# Patient Record
Sex: Female | Born: 1965 | Race: White | Hispanic: No
Health system: Southern US, Academic
[De-identification: ages and names within clinical notes are randomized; demographics above are authoritative.]

## PROBLEM LIST (undated history)

## (undated) DIAGNOSIS — I10 Essential (primary) hypertension: Secondary | ICD-10-CM

## (undated) DIAGNOSIS — I4891 Unspecified atrial fibrillation: Secondary | ICD-10-CM

## (undated) DIAGNOSIS — F419 Anxiety disorder, unspecified: Secondary | ICD-10-CM

## (undated) DIAGNOSIS — J45909 Unspecified asthma, uncomplicated: Secondary | ICD-10-CM

## (undated) DIAGNOSIS — K219 Gastro-esophageal reflux disease without esophagitis: Secondary | ICD-10-CM

## (undated) DIAGNOSIS — N200 Calculus of kidney: Secondary | ICD-10-CM

## (undated) DIAGNOSIS — F32A Depression, unspecified: Secondary | ICD-10-CM

## (undated) DIAGNOSIS — F319 Bipolar disorder, unspecified: Secondary | ICD-10-CM

## (undated) DIAGNOSIS — J449 Chronic obstructive pulmonary disease, unspecified: Secondary | ICD-10-CM

## (undated) DIAGNOSIS — K279 Peptic ulcer, site unspecified, unspecified as acute or chronic, without hemorrhage or perforation: Secondary | ICD-10-CM

## (undated) HISTORY — DX: Calculus of kidney: N20.0

## (undated) HISTORY — PX: FACIAL FRACTURE SURGERY: SHX1570

## (undated) HISTORY — DX: Unspecified asthma, uncomplicated: J45.909

## (undated) HISTORY — DX: Chronic obstructive pulmonary disease, unspecified (CMS HCC): J44.9

## (undated) HISTORY — DX: Unspecified atrial fibrillation (CMS HCC): I48.91

## (undated) HISTORY — PX: WRIST SURGERY: SHX841

## (undated) HISTORY — DX: Anxiety disorder, unspecified: F41.9

## (undated) HISTORY — PX: HX GALL BLADDER SURGERY/CHOLE: SHX55

## (undated) HISTORY — DX: Unspecified atrial fibrillation: I48.91

## (undated) HISTORY — DX: Depression, unspecified: F32.A

## (undated) HISTORY — DX: Gastro-esophageal reflux disease without esophagitis: K21.9

## (undated) HISTORY — DX: Essential (primary) hypertension: I10

## (undated) HISTORY — DX: Peptic ulcer, site unspecified, unspecified as acute or chronic, without hemorrhage or perforation: K27.9

---

## 2014-05-21 ENCOUNTER — Emergency Department (HOSPITAL_COMMUNITY)
Admission: EM | Admit: 2014-05-21 | Discharge: 2014-05-21 | Disposition: A | Discharge status: Home / Self Care | Payer: Medicaid - Out of State | Attending: Emergency Medicine | Admitting: Emergency Medicine

## 2014-05-21 ENCOUNTER — Emergency Department (HOSPITAL_COMMUNITY): Payer: Medicaid - Out of State

## 2014-05-21 ENCOUNTER — Encounter (HOSPITAL_COMMUNITY): Payer: Self-pay | Admitting: *Deleted

## 2014-05-21 DIAGNOSIS — J069 Acute upper respiratory infection, unspecified: Secondary | ICD-10-CM

## 2014-05-21 DIAGNOSIS — Z792 Long term (current) use of antibiotics: Secondary | ICD-10-CM | POA: Diagnosis not present

## 2014-05-21 DIAGNOSIS — Z880 Allergy status to penicillin: Secondary | ICD-10-CM | POA: Diagnosis not present

## 2014-05-21 DIAGNOSIS — Z7952 Long term (current) use of systemic steroids: Secondary | ICD-10-CM | POA: Insufficient documentation

## 2014-05-21 DIAGNOSIS — R05 Cough: Secondary | ICD-10-CM

## 2014-05-21 DIAGNOSIS — Z79899 Other long term (current) drug therapy: Secondary | ICD-10-CM | POA: Insufficient documentation

## 2014-05-21 DIAGNOSIS — R059 Cough, unspecified: Secondary | ICD-10-CM

## 2014-05-21 MED ORDER — AZITHROMYCIN 250 MG PO TABS: ORAL_TABLET | ORAL | Status: AC

## 2014-05-21 MED ORDER — HYDROCOD POLST-CHLORPHEN POLST 10-8 MG/5ML PO LQCR
5.0000 mL | Freq: Once | ORAL | Status: AC
  Administered 2014-05-21: 5 mL via ORAL
  Filled 2014-05-21: qty 5

## 2014-05-21 MED ORDER — ALBUTEROL SULFATE HFA 108 (90 BASE) MCG/ACT IN AERS: 1.0000 | INHALATION_SPRAY | Freq: Four times a day (QID) | RESPIRATORY_TRACT | Status: AC | PRN

## 2014-05-21 MED ORDER — HYDROCOD POLST-CHLORPHEN POLST 10-8 MG/5ML PO LQCR: 5.0000 mL | Freq: Two times a day (BID) | ORAL | Status: AC | PRN

## 2014-05-21 MED ORDER — PREDNISONE 50 MG PO TABS: ORAL_TABLET | ORAL | Status: AC

## 2014-05-21 NOTE — ED Notes (Signed)
Pt with congestion, ? Fever, cough-productive, +N/V since Tuesday
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
## 2014-05-21 NOTE — Discharge Instructions (Signed)
Chest x-ray showed no pneumonia. Would hold antibiotic for a couple days.  Increase fluids. Prescription for cough syrup, prednisone, inhaler, antibiotic.

## 2015-08-14 IMAGING — DX DG CHEST 2V
2 series · 2 of 2 positions shown · non-contrast
Comparison: None

CLINICAL DATA: Cough

EXAM:
CHEST  2 VIEW

[chest pa]
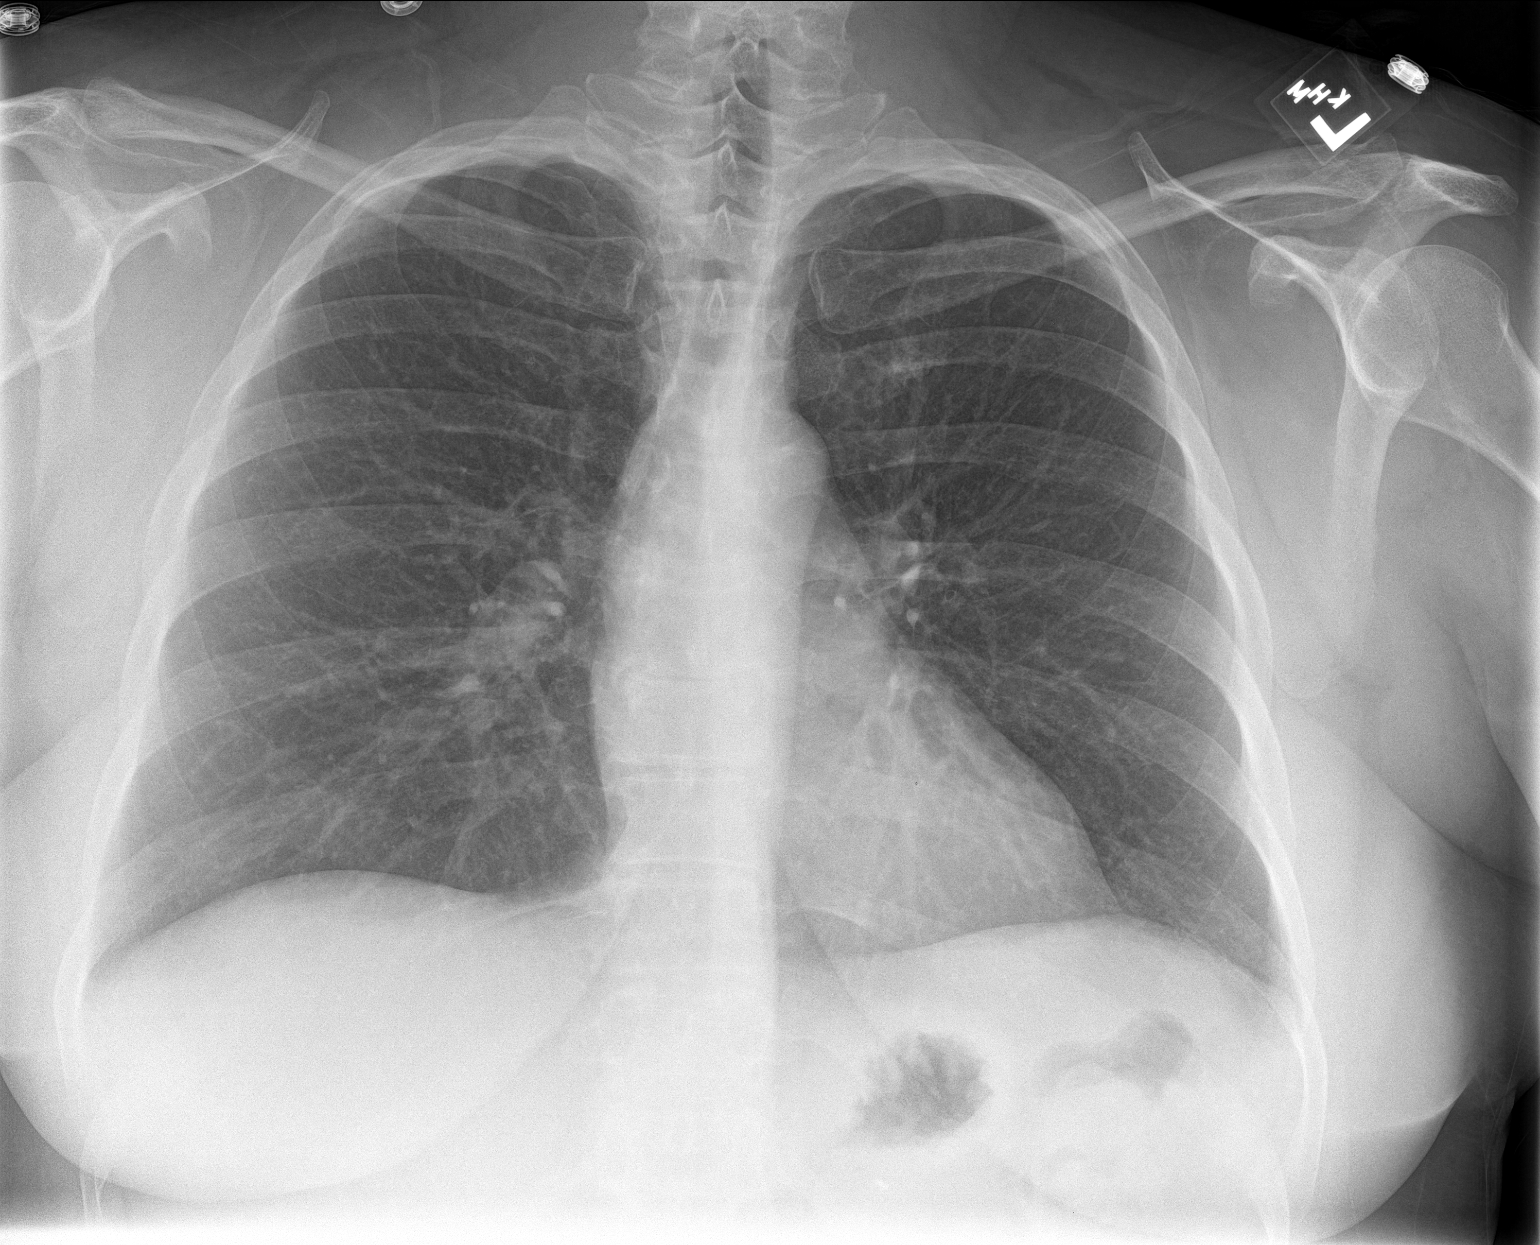

[chest lat]
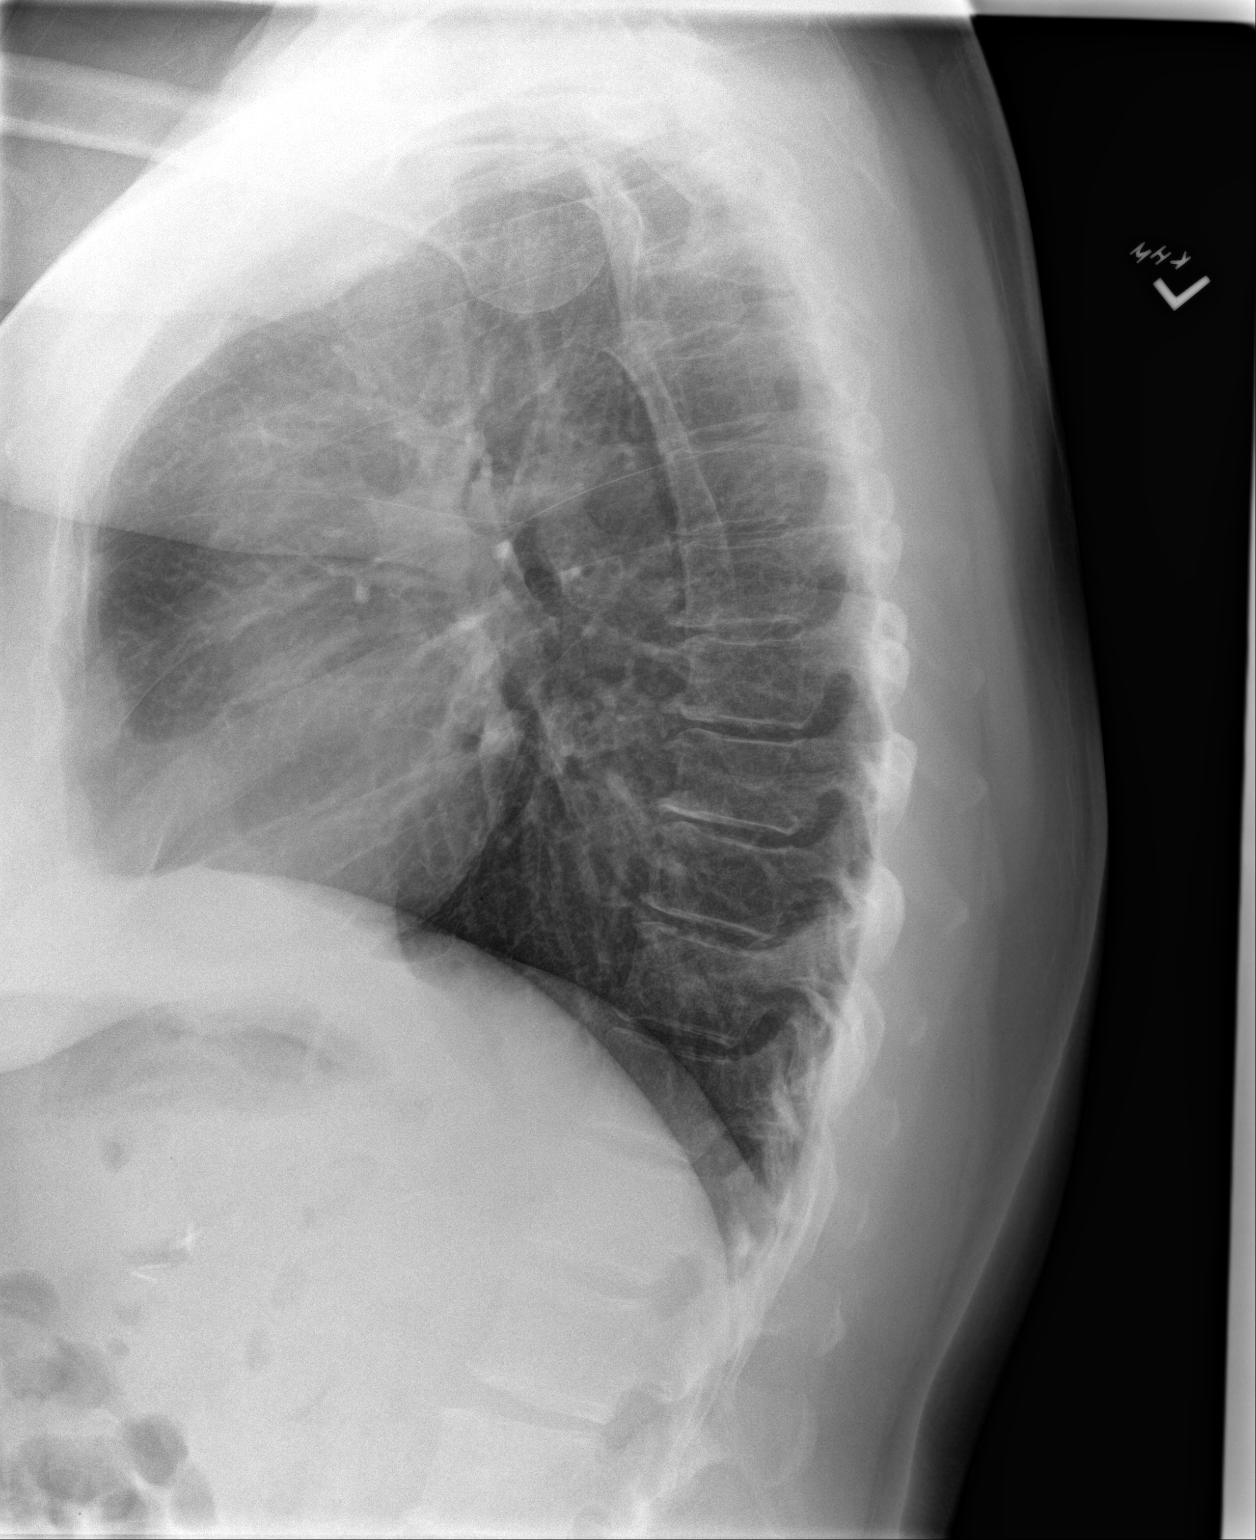

[2 of 2 positions shown; findings below may reference images not displayed]

FINDINGS: Normal heart size, mediastinal contours, and pulmonary vascularity.

Lungs clear.

No pleural effusion or pneumothorax.

Bones unremarkable.
IMPRESSION: Normal exam.

## 2018-02-23 ENCOUNTER — Ambulatory Visit (HOSPITAL_COMMUNITY): Payer: Self-pay

## 2021-06-10 ENCOUNTER — Encounter (HOSPITAL_COMMUNITY): Payer: Self-pay

## 2021-06-10 ENCOUNTER — Other Ambulatory Visit: Payer: Self-pay

## 2021-06-10 ENCOUNTER — Emergency Department
Admission: EM | Admit: 2021-06-10 | Discharge: 2021-06-10 | Disposition: A | Discharge status: Home / Self Care | Payer: Medicaid Other | Attending: Emergency Medicine | Admitting: Emergency Medicine

## 2021-06-10 ENCOUNTER — Emergency Department (HOSPITAL_COMMUNITY): Payer: Medicaid Other

## 2021-06-10 DIAGNOSIS — J069 Acute upper respiratory infection, unspecified: Secondary | ICD-10-CM | POA: Insufficient documentation

## 2021-06-10 DIAGNOSIS — Z20822 Contact with and (suspected) exposure to covid-19: Secondary | ICD-10-CM | POA: Insufficient documentation

## 2021-06-10 DIAGNOSIS — J019 Acute sinusitis, unspecified: Secondary | ICD-10-CM | POA: Insufficient documentation

## 2021-06-10 HISTORY — DX: Bipolar disorder, unspecified (CMS HCC): F31.9

## 2021-06-10 LAB — COVID-19 ~~LOC~~ MOLECULAR LAB TESTING: SARS-CoV-2: NOT DETECTED

## 2021-06-10 LAB — INFLUENZA VIRUS TYPE A AND TYPE B, PCR
INFLUENZA A RNA: NEGATIVE
INFLUENZA B RNA: NEGATIVE

## 2021-06-10 MED ORDER — AZITHROMYCIN 500 MG TABLET
500.0000 mg | ORAL_TABLET | Freq: Every day | ORAL | 0 refills | Status: AC
Start: 2021-06-10 — End: 2021-06-13

## 2021-06-10 MED ORDER — BENZONATATE 100 MG CAPSULE
100.0000 mg | ORAL_CAPSULE | Freq: Three times a day (TID) | ORAL | 0 refills | Status: DC | PRN
Start: 2021-06-10 — End: 2021-10-07

## 2021-06-10 NOTE — ED Provider Notes (Signed)
Lauren Berger  ED Primary Provider Note  History of Present Illness   Chief Complaint   Patient presents with   . Cough     Nonproductive cough, nasal congestion x 3 days     Arrival: The patient arrived by Car accompanied by:  Self   History Provided by:  Self .  History Limitations:  None  Lauren Berger is a 56 y.o. female who had concerns including Cough.   Patient complained of a cough congestion frontal headache pressure over sinuses coughing up thick mucus some shortness breath at time pain in the left lower rib cage under left breast.  It has been going on for 3 days tried over-the-counter medication nothing helping he took COVID test at home 3 times and was negative she previously had COVID vaccine but did not have the influenza vaccine, and the other hand patient states she has not been out of the house or exposed to anybody and no visitors and she lives alone, states she does not know how she got this.    Constitutional: No fevers or chills.  Skin:  No rashes or lesions.  HENT:  See HPI  Eyes:  No vision changes, no redness, no discharge.  Cardio:  No chest pain, no palpitations.  Respiratory:  See HPI  GI:  No nausea, vomiting, diarrhea, constipation, no abdominal pain.  GU:  No dysuria, urgency, or frequency.  Endocrine: No polyuria, polydypsia, thyroid issues.  Hematology:  No easy bruising or bleeding.  Musculoskeletal:  No neck pain, no back pain.  Neuro:  No numbness, tingling, or weakness.  No headache.  Psychiatry:  No SI or HI.  Normal mood.   All other ROS are negative except as above and per HPI.    Historical Data   Below pertinent information reviewed with patient/caregiver and/or EMR:  Past Medical History:   Diagnosis Date   . Bipolar 1 disorder (CMS HCC)      Previous Medications    LITHIUM CARBONATE (ESKALITH) 300 MG ORAL CAPSULE    Take 1 Capsule (300 mg total) by mouth Three times daily with meals     Allergies   Allergen Reactions   . Penicillins       Past Surgical History:   Procedure Laterality Date   . FACIAL FRACTURE SURGERY     . HX CESAREAN SECTION     . WRIST SURGERY Right        Physical Exam   ED Triage Vitals [06/10/21 0806]   BP (Non-Invasive) 116/66   Heart Rate 88   Respiratory Rate 18   Temperature 37.1 C (98.8 F)   SpO2 96 %   Weight 132 kg (290 lb)   Height 1.676 m (5\' 6" )     Nursing notes and vital signs reviewed.      Constitutional:  Patient is well-developed and well-nourished.  A&O x 3.   Head:  Normocephalic and atraumatic.  Neck:  Soft, supple, painless range of motion.  Eyes:   Ears, Nose, Throat:  Both external ears normal.  Nares clear, no congestion.  Uvula midline.  Oropharynx without erythema. Mucous membranes moist.  There is tenderness or a frontal and maxillary sinuses without swelling.  Cardiovascular:  Regular rate.  Regular rhythm. No, murmur, rub, or gallop.  Distal pulses present and equal bilaterally.  Pulmonary:  Airway normal.  Bilateral breath sounds clear, without wheezes, rales or rhonchi.  Abdominal: Soft, nontender, nondistended, positive bowel sounds.  No rebound,  guarding, or masses.  Neurological:  Cranial nerves 2-12 grossly intact.  No focal motor or sensory deficits noted.  Musculoskeletal:  No acute gross deformities.  Normal gait.  Extremities:  No clubbing, cyanosis.  No edema.   Psychiatric: Patient has a normal mood and affect.   Skin: Warm and dry. No rash or lesions.  Normal cap refill <2s.   Breast: deferred.  Genitourinary: deferred.  Rectal: deferred.    Patient Data     Labs:   Labs Ordered/Reviewed   COVID-19 [place] [facility]LECULAR LAB TESTING - Normal   INFLUENZA VIRUS TYPE A AND TYPE B, PCR - Normal     Imaging:  XR AP MOBILE CHEST   Final Result by Edi, Radresults In (02/05 0935)   NO ACUTE FINDINGS.         Radiologist location ID: [id number]           Procedures:  Procedures      Medical Decision Making   MDM/Course:  Lauren Berger is a 56 y.o. female who presented with:             Medical Decision Making  Symptoms consistent viral illness could be influenza could be COVID also consider sinus infection bronchitis and pneumonia will be testing for COVID and influenza as well as a chest x-ray to rule out pneumonia    Acute non-recurrent sinusitis, unspecified location: acute illness or injury     Details: Sinusitis could be bacterial or viral will go ahead and try her on Zithromax.  Upper respiratory tract infection, unspecified type: acute illness or injury     Details: This is mostly bottle will give her Tessalon Perles to treat her symptoms.  Amount and/or Complexity of Data Reviewed  Radiology: ordered.               Medications given:       Clinical Impression   Acute non-recurrent sinusitis, unspecified location (Primary)   Upper respiratory tract infection, unspecified type     Following the history, physical exam, and ED workup, the patient was deemed stable and suitable for discharge. The patient/caregiver was advised to return to the ED for any new or worsening symptoms. Discharge medications, and follow-up instructions were discussed with the patient/caregiver in detail, who verbalizes understanding. The patient/caregiver is in agreement and is comfortable with the plan of care.    Disposition: Discharged         Current Discharge Medication List      START taking these medications.      Details   azithromycin 500 mg Tablet  Commonly known as: ZITHROMAX   500 mg, Oral, DAILY  Qty: 3 Tablet  Refills: 0     benzonatate 100 mg Capsule  Commonly known as: TESSALON   100 mg, Oral, EVERY 8 HOURS PRN  Qty: 15 Capsule  Refills: 0        CONTINUE these medications - NO CHANGES were made during your visit.      Details   lithium carbonate 300 mg Capsule  Commonly known as: ESKALITH   300 mg, Oral, 3 TIMES DAILY WITH MEALS  Refills: 0          Follow up:   Juanna Cao, [place]  [place] [address]  [phone]    In 3 days  If symptoms worsen                  Eugenio Hoes, MD    Parts  of this patients chart were completed in a retrospective fashion due to simultaneous direct patient care activities in the Emergency Department. This note was partially generated using MModal Fluency Direct system, and there may be some incorrect words, spellings, and punctuation that were not noted in checking the note before saving.

## 2021-06-10 NOTE — ED Triage Notes (Signed)
Patient ambulatory to department for cough, nasal congestion x 3 days. Patient states has taken 3 Covid tests, all negative. Last test was last night. Patient states chest feels congested as well, has a nonproductive cough.

## 2021-06-10 NOTE — ED Nurses Note (Signed)
Discharge instructions provided to and reviewed with patient, who verbalized understanding. Discharged from department, ambulatory, in no acute distress.

## 2021-07-19 ENCOUNTER — Other Ambulatory Visit: Payer: Self-pay

## 2021-07-19 ENCOUNTER — Other Ambulatory Visit (HOSPITAL_COMMUNITY): Payer: Self-pay | Admitting: NURSE PRACTITIONER

## 2021-07-19 ENCOUNTER — Other Ambulatory Visit (HOSPITAL_COMMUNITY): Payer: Medicaid Other

## 2021-07-19 ENCOUNTER — Inpatient Hospital Stay (HOSPITAL_COMMUNITY)
Admission: RE | Admit: 2021-07-19 | Discharge: 2021-07-19 | Disposition: A | Discharge status: Home / Self Care | Payer: Medicaid Other | Source: Ambulatory Visit | Attending: NURSE PRACTITIONER | Admitting: NURSE PRACTITIONER

## 2021-07-19 ENCOUNTER — Inpatient Hospital Stay
Admission: RE | Admit: 2021-07-19 | Discharge: 2021-07-19 | Disposition: A | Discharge status: Home / Self Care | Payer: Medicaid Other | Source: Ambulatory Visit | Attending: NURSE PRACTITIONER | Admitting: NURSE PRACTITIONER

## 2021-07-19 DIAGNOSIS — F319 Bipolar disorder, unspecified: Secondary | ICD-10-CM | POA: Insufficient documentation

## 2021-07-19 DIAGNOSIS — R635 Abnormal weight gain: Secondary | ICD-10-CM | POA: Insufficient documentation

## 2021-07-19 DIAGNOSIS — R600 Localized edema: Secondary | ICD-10-CM

## 2021-07-19 DIAGNOSIS — Z5181 Encounter for therapeutic drug level monitoring: Secondary | ICD-10-CM

## 2021-07-19 LAB — CBC WITH DIFF
BASOPHIL #: 0.1 10*3/uL (ref 0.00–0.20)
BASOPHIL %: 1 % (ref 0–2)
EOSINOPHIL #: 0.2 10*3/uL (ref 0.00–0.60)
EOSINOPHIL %: 3 % (ref 0–5)
HCT: 37.8 % (ref 36.0–48.0)
HGB: 12.6 g/dL (ref 12.0–15.0)
LYMPHOCYTE #: 2.3 10*3/uL (ref 1.10–3.80)
LYMPHOCYTE %: 31 % (ref 19–46)
MCH: 28.1 pg (ref 24.4–34.0)
MCHC: 33.3 g/dL (ref 30.0–37.0)
MCV: 84.2 fL (ref 79.0–88.0)
MONOCYTE #: 0.5 10*3/uL (ref 0.10–0.80)
MONOCYTE %: 6 % (ref 4–12)
MPV: 8 fL (ref 7.5–11.5)
NEUTROPHIL #: 4.3 10*3/uL (ref 1.80–7.50)
NEUTROPHIL %: 59 % (ref 41–69)
PLATELETS: 311 10*3/uL (ref 130–400)
RBC: 4.48 10*6/uL (ref 3.50–5.50)
RDW: 13.9 % (ref 11.5–14.0)
WBC: 7.3 10*3/uL (ref 4.5–11.5)

## 2021-07-19 LAB — COMPREHENSIVE METABOLIC PNL, FASTING
ALBUMIN/GLOBULIN RATIO: 1.6 (ref 1.5–2.5)
ALBUMIN: 4.2 g/dL (ref 3.5–5.0)
ALKALINE PHOSPHATASE: 71 U/L (ref 38–126)
ALT (SGPT): 32 U/L (ref 5–34)
ANION GAP: 5 mmol/L (ref 5–19)
AST (SGOT): 23 U/L (ref 14–36)
BILIRUBIN TOTAL: 0.7 mg/dL (ref 0.2–1.3)
BUN/CREA RATIO: 23 — ABNORMAL HIGH (ref 6–20)
BUN: 18 mg/dL — ABNORMAL HIGH (ref 7–17)
CALCIUM: 8.9 mg/dL (ref 8.4–10.2)
CHLORIDE: 108 mmol/L — ABNORMAL HIGH (ref 98–107)
CO2 TOTAL: 25 mmol/L (ref 22–30)
CREATININE: 0.8 mg/dL (ref 0.50–1.00)
ESTIMATED GFR: 86 mL/min/{1.73_m2} (ref >60)
GLUCOSE: 121 mg/dL — ABNORMAL HIGH (ref 74–106)
POTASSIUM: 4.2 mmol/L (ref 3.5–5.1)
PROTEIN TOTAL: 6.9 g/dL (ref 6.3–8.2)
SODIUM: 138 mmol/L (ref 137–145)

## 2021-07-19 LAB — LITHIUM LEVEL: LITHIUM LEVEL: 0.21 mmol/L — ABNORMAL LOW (ref 0.60–1.20)

## 2021-07-20 LAB — ECG 12-LEAD
Atrial Rate: 71 {beats}/min
Calculated P Axis: 43 degrees
Calculated R Axis: 31 degrees
Calculated T Axis: 31 degrees
PR Interval: 160 ms
QRS Duration: 98 ms
QT Interval: 392 ms
QTC Calculation: 425 ms
Ventricular rate: 71 {beats}/min

## 2021-07-20 LAB — THYROID STIMULATING HORMONE WITH FREE T4 REFLEX: TSH: 2.3 u[IU]/mL (ref 0.470–4.680)

## 2021-08-03 ENCOUNTER — Other Ambulatory Visit: Payer: Self-pay

## 2021-10-07 ENCOUNTER — Encounter (INDEPENDENT_AMBULATORY_CARE_PROVIDER_SITE_OTHER): Payer: Self-pay | Admitting: NEPHROLOGY

## 2021-10-11 ENCOUNTER — Other Ambulatory Visit: Payer: Self-pay

## 2021-10-11 ENCOUNTER — Ambulatory Visit (INDEPENDENT_AMBULATORY_CARE_PROVIDER_SITE_OTHER): Payer: Medicaid Other | Admitting: Internal Medicine

## 2021-10-11 ENCOUNTER — Encounter (INDEPENDENT_AMBULATORY_CARE_PROVIDER_SITE_OTHER): Payer: Self-pay | Admitting: Internal Medicine

## 2021-10-11 VITALS — BP 118/72 | HR 80 | Temp 97.6°F | Resp 18 | Ht 66.0 in | Wt 302.0 lb

## 2021-10-11 DIAGNOSIS — N182 Chronic kidney disease, stage 2 (mild): Secondary | ICD-10-CM

## 2021-10-11 DIAGNOSIS — F319 Bipolar disorder, unspecified: Secondary | ICD-10-CM

## 2021-10-11 NOTE — Progress Notes (Signed)
NEPHROLOGY, [facility]  [place] [address]  Phone: [phone]  Fax: [phone]      Encounter Date: 10/11/2021    Patient ID: Lauren Berger  MRN: [id number]    DOB: 1965/09/16  Age: 56 y.o. female     Progress Note       Reason for Visit: Follow up for No chief complaint on file.       History of Present Illness:  Lauren Berger is a 56 y.o. female  here for follow up of questionable renal mass shown on renal US as well CKD2 f/u. She has history of bipolar disorder with long time use of Lithium. Baseline creatinine is normal, most recent creatinine .98. She voices no concerns.    Patient History:  There is no problem list on file for this patient.    Past Medical History:   Diagnosis Date   . Anxiety    . Asthma    . Bipolar 1 disorder (CMS HCC)    . Depression    . Esophageal reflux    . Hypertension    . Kidney stones    . PUD (peptic ulcer disease)       Past Surgical History:   Procedure Laterality Date   . FACIAL FRACTURE SURGERY     . HX CESAREAN SECTION     . HX CHOLECYSTECTOMY     . WRIST SURGERY Right       Social History     Tobacco Use   . Smoking status: Never   . Smokeless tobacco: Never   Substance Use Topics   . Alcohol use: Never   . Drug use: Never       Family Medical History:     Problem Relation (Age of Onset)    Anxiety Mother    Asthma Mother    Depression Mother    Emphysema Mother    Heart Attack Father    Heart Disease Father    Hypertension (High Blood Pressure) Father         Outpatient Medications Marked as Taking for the 10/11/21 encounter (Office Visit) with Alfonso Patten, MD   Medication Sig   . lithium carbonate (ESKALITH) 300 mg Oral Capsule Take 1 Capsule (300 mg total) by mouth Three times daily with meals       Allergies   Allergen Reactions   . Cephalosporins    . Penicillin G Benzathin,Procain    . Penicillins    . Sulfa (Sulfonamides)         Review of Systems:  Review of Systems as discussed in HPI    Physical  Examination:  BP 118/72   Pulse 80   Temp 36.4 C (97.6 F)   Resp 18   Ht 1.676 m (5\' 6" )   Wt (!) 137 kg (302 lb)   SpO2 98%   BMI 48.74 kg/m      Body mass index is 48.74 kg/m.   General: alert and oriented, well nourished  Neck/Thyroid: supple. No JVD  Cardiovascular:  Regular rate and rhythm, no peripheral edema  Respiratory: normal respiratory effort, lungs clear to auscultation bilaterally  Gastrointestinal: normal abdomen, normal bowel sounds.    Neurological: non focal   Skin: no rash or no skin lesions   Psych: Alert,oriented x3 Appropriate mood and affect.        Assessment/Plan:   1. CKD Stage G2A?   Etiology from long time lithium use for bipolar disorder. Baseline creatinine normal,  most recent creatinine .98. GFR 68. ACR not obtained.     2. Hypertension:   Well control. Goal less than 130/80.     3. Electrolytes/Metabolic:  Status stable.     4. Anemia management:  Hgb stable. No indication for ESA at this time.     5. CKD-MBD:  Vit D suboptimal will replete OTC,  iPTH pending.    6.Bipolar disorder type 1:   managed by mental health- on lithium.     7. Questionable renal mass:  Renal US showed echogenic lesion in upper pole right kidney, follow up CT with and w/out IV contrast demonstrates no mass or lesion.      COUNSELING:  1)  Patient was counseled regarding renal diet and low salt diet.  2)  Patient was counseled regarding diet and exercise.  3)  Avoid NSAIDS (ibuprofen, advil, aleve...)  4)  Patient was asked to call before any procedure requiring IV contrast.    No orders of the defined types were placed in this encounter.     Return in about 6 months (around 04/12/2022) for In Person Visit, with routine labs.        Orie Rout  10/11/2021, 13:36     This note was partially generated using MModal Fluency Direct system, and there may be some incorrect words, spellings, and punctuation that were not noted in checking the note before saving.

## 2021-10-12 NOTE — Addendum Note (Signed)
Addended by: Terrilyn Saver on: 10/12/2021 07:26 AM     Modules accepted: Orders

## 2021-10-18 ENCOUNTER — Inpatient Hospital Stay
Admission: RE | Admit: 2021-10-18 | Discharge: 2021-10-18 | Disposition: A | Discharge status: Home / Self Care | Payer: Medicaid Other | Source: Ambulatory Visit | Attending: NURSE PRACTITIONER | Admitting: NURSE PRACTITIONER

## 2021-10-18 ENCOUNTER — Other Ambulatory Visit: Payer: Self-pay

## 2021-10-18 ENCOUNTER — Other Ambulatory Visit (HOSPITAL_COMMUNITY): Payer: Self-pay | Admitting: NURSE PRACTITIONER

## 2021-10-18 DIAGNOSIS — M545 Low back pain, unspecified: Secondary | ICD-10-CM

## 2021-11-10 ENCOUNTER — Emergency Department (HOSPITAL_COMMUNITY): Payer: Medicaid Other

## 2021-11-10 ENCOUNTER — Emergency Department
Admission: EM | Admit: 2021-11-10 | Discharge: 2021-11-10 | Disposition: A | Discharge status: Home / Self Care | Payer: Medicaid Other | Attending: Family Medicine | Admitting: Family Medicine

## 2021-11-10 ENCOUNTER — Encounter (HOSPITAL_COMMUNITY): Payer: Self-pay

## 2021-11-10 ENCOUNTER — Other Ambulatory Visit: Payer: Self-pay

## 2021-11-10 DIAGNOSIS — S86912A Strain of unspecified muscle(s) and tendon(s) at lower leg level, left leg, initial encounter: Secondary | ICD-10-CM | POA: Insufficient documentation

## 2021-11-10 DIAGNOSIS — W010XXA Fall on same level from slipping, tripping and stumbling without subsequent striking against object, initial encounter: Secondary | ICD-10-CM | POA: Insufficient documentation

## 2021-11-10 DIAGNOSIS — M1712 Unilateral primary osteoarthritis, left knee: Secondary | ICD-10-CM | POA: Insufficient documentation

## 2021-11-10 NOTE — ED Nurses Note (Signed)
Knee immobilizer placed by Kathlene November, LPN at this time and education on crutch use. Patient demonstrates use of crutches at this time. Patient is given discharge instructions at this time. Denies any questions. Patient advised to follow up with PCP. Patient ambulatory from ED without incident at this time.

## 2021-11-10 NOTE — ED Nurses Note (Signed)
Dr. Katrinka Blazing in room speaking with patient about results at this time.
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
## 2021-11-10 NOTE — ED Nurses Note (Signed)
X-ray at bedside at this time.

## 2021-11-10 NOTE — Discharge Instructions (Addendum)
There is no evidence of fracture today.  You may have strained or sprained your knee.  If this persists you will need further evaluation by an orthopedic surgeon.  You can contact your primary care physician for referral as needed.  Take ibuprofen or Tylenol for pain for now.  We will give you crutches and splint the area for now, but if the pain subsides you can begin weight-bearing as tolerated.  Refrain if you have severe pain or swelling of the knee.
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
## 2021-12-04 ENCOUNTER — Other Ambulatory Visit (HOSPITAL_COMMUNITY): Payer: Self-pay

## 2021-12-04 DIAGNOSIS — R0602 Shortness of breath: Secondary | ICD-10-CM

## 2021-12-06 ENCOUNTER — Other Ambulatory Visit (HOSPITAL_COMMUNITY): Payer: Self-pay

## 2021-12-06 DIAGNOSIS — R0602 Shortness of breath: Secondary | ICD-10-CM

## 2021-12-11 ENCOUNTER — Ambulatory Visit (HOSPITAL_COMMUNITY): Payer: Self-pay

## 2021-12-17 ENCOUNTER — Ambulatory Visit (HOSPITAL_COMMUNITY)
Admission: RE | Admit: 2021-12-17 | Discharge: 2021-12-17 | Disposition: A | Discharge status: Still In Hospital | Payer: Medicaid Other | Source: Ambulatory Visit | Attending: ORTHOPAEDIC SURGERY | Admitting: ORTHOPAEDIC SURGERY

## 2021-12-17 ENCOUNTER — Other Ambulatory Visit: Payer: Self-pay

## 2021-12-17 DIAGNOSIS — Z5189 Encounter for other specified aftercare: Secondary | ICD-10-CM | POA: Insufficient documentation

## 2021-12-17 DIAGNOSIS — S83242A Other tear of medial meniscus, current injury, left knee, initial encounter: Secondary | ICD-10-CM | POA: Insufficient documentation

## 2021-12-17 NOTE — PT Treatment (Signed)
[facility]  Outpatient Physical Therapy  [address] [place], [place] [address]  [phone]   (Fax) [phone]      Physical Therapy Treatment Note    Date: 12/17/2021  Patient's Name: Lauren Berger  Date of Birth: 22-Jan-1966    Evaluating Physical Therapist: Delrae Alfred  PT diagnosis: W62.035D Medial Meniscus Tear Left Knee   Insurance:     Certification from: 12/17/2021  to  01/17/2022  30 day POC signature due:  01/17/2022  POC signed?:  No  Next progress note due: visit #  10  Plan of Care/treatment reviewed by/with physical therapist: Yes    Plan of Care Visit Number: 1    Authorized Insurance Visit Number:     Next Scheduled Physician Appointment: January 07, 2022    Subjective: PT eval completed today    Objective: )There ex consisting of:  quad set x 10  glut set x 10  supine hip abduction x 10  ankle pumps x 10  hook-lying clamshell with Blue T-band x 10  short arc quad x 10  calf stretch 20 sec x 5  seated knee flexion/extension ROM/towel slides x 10  seated LAQ x 10  seated heel raises 10 reps.      Patient did not tolerate hamstring stretch.         Assessment/Plan: Pain symptoms range from 0/10 at rest to 8-10/10 with activity tolerance.  Standing activity tolerance 2 minutes.  Patient did not tolerate special test.  Severe pain with knee extension.  Patient had no questions with HEXP handout.    Patient Specific Functional Scale score: 1 for ambulation    Treatments and minutes performed:  )There ex (10:07-10:38) for 31 minutes consisting of quad set, glut set, supine hip abduction, ankle pumps, hook-lying clamshell with Blue T-band, short arc quad, calf stretch 20 sec x 5, seated knee flexion/extension ROM/towel slides, seated LAQ, seated heel raises all x 10 reps.  Patient did not tolerate hamstring stretch.  2)IFES with ice x 15 minutes to left knee hook-lying on bed. (10:38-10:53)        Total Timed Treatment Minutes: 31     Total Treatment Time  Minutes: 46       Signature:  Delrae Alfred, PT
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
## 2021-12-20 ENCOUNTER — Other Ambulatory Visit: Payer: Self-pay

## 2021-12-20 ENCOUNTER — Ambulatory Visit (HOSPITAL_COMMUNITY)
Admission: RE | Admit: 2021-12-20 | Discharge: 2021-12-20 | Disposition: A | Discharge status: Still In Hospital | Payer: Medicaid Other | Source: Ambulatory Visit | Attending: ORTHOPAEDIC SURGERY | Admitting: ORTHOPAEDIC SURGERY

## 2021-12-20 NOTE — PT Treatment (Signed)
[facility]  Outpatient Physical Therapy  [address] [place], [place] [address]  [phone]   (Fax) [phone]      Physical Therapy Treatment Note    Date: 12/20/2021  Patient's Name: Lauren Berger  Date of Birth: [birth year]    Evaluating Physical Therapist: Delrae Alfred  PT diagnosis: Y64.158X Medial Meniscus Tear Left Knee   Insurance:     Certification from: 12/17/2021  to  01/17/2022  30 day POC signature due:  01/17/2022  POC signed?:  No  Next progress note due: visit #  10  Plan of Care/treatment reviewed by/with physical therapist: Yes    Plan of Care Visit Number: 2    Authorized Insurance Visit Number:     Next Scheduled Physician Appointment: January 07, 2022    Subjective:  Patient reports being compliant with HEXP.  Patient reports no change in symptoms.    Objective: )There ex consisting of:  quad set x 10  glut set x 10  supine hip abduction x 10  ankle pumps x 10  hook-lying clamshell with Blue T-band x 10  short arc quad x 10  calf stretch 20 sec x 5  seated knee flexion/extension ROM/towel slides x 10  seated LAQ x 10  seated heel raises 10 reps  Standing SLR flex/abd x 10  Standing calf raises x 10  Standing hip flexion/marching x 10     Patient did not tolerate hamstring stretch.         Assessment/Plan: Pain symptoms range from 0/10 at rest to 8-10/10 with activity tolerance.  Improved ambulation tolerance.  Patient had no questions with HEXP handout.    Patient Specific Functional Scale score: 2-3 for ambulation    Treatments and minutes performed:  )There ex (7:57-8:26) for 29 minutes consisting of quad set, glut set, supine hip abduction, ankle pumps, hook-lying clamshell with Blue T-band, short arc quad, calf stretch 20 sec x 5, seated knee flexion/extension ROM/towel slides, seated LAQ, seated heel raises, standing SLR flex/abd, standing calf raise, standing marching/hip flexion all x 10 reps.  Patient did not tolerate hamstring  stretch.  2)IFES with ice x 15 minutes to left knee hook-lying on bed. (8:28-8:43)        Total Timed Treatment Minutes: 29     Total Treatment Time Minutes: 44       Signature:  Delrae Alfred, PT

## 2021-12-24 ENCOUNTER — Ambulatory Visit (HOSPITAL_COMMUNITY)
Admission: RE | Admit: 2021-12-24 | Discharge: 2021-12-24 | Disposition: A | Discharge status: Still In Hospital | Payer: Medicaid Other | Source: Ambulatory Visit | Attending: ORTHOPAEDIC SURGERY | Admitting: ORTHOPAEDIC SURGERY

## 2021-12-24 ENCOUNTER — Other Ambulatory Visit: Payer: Self-pay

## 2021-12-24 NOTE — PT Treatment (Signed)
[facility]  Outpatient Physical Therapy  [address], [place], [place] [address]  [phone]   (Fax) [phone]      Physical Therapy Treatment Note    Date: 12/24/2021  Patient's Name: Lauren Berger  Date of Birth: 07-10-1965    Evaluating Physical Therapist: Delrae Alfred  PT diagnosis: I94.854O Medial Meniscus Tear Left Knee   Insurance:     Certification from: 12/17/2021  to  01/17/2022  30 day POC signature due:  01/17/2022  POC signed?:  No  Next progress note due: visit #  10  Plan of Care/treatment reviewed by/with physical therapist: Yes    Plan of Care Visit Number: 3    Authorized Insurance Visit Number:     Next Scheduled Physician Appointment: January 07, 2022    Subjective:   Patient reports no change in symptoms continues to have severe pain walking.    Objective: )There ex consisting of:  quad set x 10  glut set x 10  supine hip abduction x 10  ankle pumps x 10  hook-lying clamshell with Blue T-band x 10  short arc quad x 10  calf stretch 20 sec x 5  seated knee flexion/extension ROM/towel slides x 10  seated LAQ x 10  Standing SLR flex/abd x 10  Standing calf raises x 10  Standing hip flexion/marching x 10     Patient did not tolerate hamstring stretch.         Assessment/Plan: Pain symptoms range from 0/10 at rest to 8-10/10 with activity tolerance.  Improved ambulation tolerance with K-tape.  Patient had no questions with HEXP handout.    Patient Specific Functional Scale score: 2-3 for ambulation    Treatments and minutes performed:    1)IFES with ice x 15 minutes to left knee hook-lying on bed. (8:50-9:05)  2)K-tape applied to left knee x 5 minutes (9:05-9:10)  2)There ex (9:10-9:39) for 29 minutes consisting of quad set, glut set, supine hip abduction, ankle pumps, hook-lying clamshell with Blue T-band, short arc quad, calf stretch 20 sec x 5, seated knee flexion/extension ROM/towel slides, seated LAQ, seated heel raises, standing SLR flex/abd,  standing calf raise, standing marching/hip flexion all x 10 reps.  Patient did not tolerate hamstring stretch.        Total Timed Treatment Minutes: 29     Total Treatment Time Minutes: 44       Signature:  Delrae Alfred, PT

## 2021-12-25 ENCOUNTER — Other Ambulatory Visit (HOSPITAL_COMMUNITY): Payer: Self-pay | Admitting: Adult Health

## 2021-12-25 ENCOUNTER — Inpatient Hospital Stay (HOSPITAL_COMMUNITY)
Admission: RE | Admit: 2021-12-25 | Discharge: 2021-12-25 | Disposition: A | Discharge status: Home / Self Care | Payer: Medicaid Other | Source: Ambulatory Visit | Attending: Adult Health | Admitting: Adult Health

## 2021-12-25 ENCOUNTER — Inpatient Hospital Stay
Admission: RE | Admit: 2021-12-25 | Discharge: 2021-12-25 | Disposition: A | Discharge status: Home / Self Care | Payer: Medicaid Other | Source: Ambulatory Visit | Attending: Adult Health | Admitting: Adult Health

## 2021-12-25 ENCOUNTER — Ambulatory Visit
Admission: RE | Admit: 2021-12-25 | Discharge: 2021-12-25 | Disposition: A | Discharge status: Still In Hospital | Payer: Medicaid Other | Source: Ambulatory Visit | Attending: ORTHOPAEDIC SURGERY | Admitting: ORTHOPAEDIC SURGERY

## 2021-12-25 DIAGNOSIS — R52 Pain, unspecified: Secondary | ICD-10-CM

## 2021-12-25 DIAGNOSIS — R0602 Shortness of breath: Secondary | ICD-10-CM | POA: Insufficient documentation

## 2021-12-25 MED ORDER — ALBUTEROL SULFATE 2.5 MG/3 ML (0.083 %) SOLUTION FOR NEBULIZATION
2.5000 mg | INHALATION_SOLUTION | Freq: Once | RESPIRATORY_TRACT | Status: AC
Start: 2021-12-25 — End: 2021-12-25
  Administered 2021-12-25: 2.5 mg via RESPIRATORY_TRACT
  Filled 2021-12-25: qty 3

## 2021-12-25 NOTE — PT Treatment (Signed)
[facility]  Outpatient Physical Therapy  [address], [place], [place] [address]  [phone]   (Fax) [phone]      Physical Therapy Treatment Note    Date: 12/25/2021  Patient's Name: Lauren Berger  Date of Birth: 1965/07/23  Latex Allergy: No    Evaluating Physical Therapist: Delrae Alfred, PT  PT diagnosis: Medical Meniscus Tear Left Knee  Insurance: [employer]    Plan of Care Visit Number: 4    Next Scheduled Physician Appointment: 01/07/2022    Subjective: Patient reports 9.5/10 pain in left medial knee.  Patient reports has been awake since 4 A.M. with pain and blood pressure up.  Patient reports left knee "sticks."  Patient reports tape worked, but came off during the night.      Objective: Performed therapeutic exercises x 25 minutes ([id number]) as follows:    Ankle Pumps: 15  Quadricep sets: 10 x 5 seconds  Gluteal sets: 12 x 5 seconds  Supine hip abduction: 12  Hooklying clamshell with Blue T-band: 10  Hooklying Isometric Hip ADD: 10 x 5 seconds  Short arc quad: 10 x 5 seconds  Supine Heel Slides: 10  SLR: 10 with assist, no hold  Calf stretch:  20 sec x 5  Seated knee flexion/extension ROM/towel slides:  10  Seated LAQ: 10  Seated Marching: 10  Seated Heel Toe Raises: 10  Standing Hip Flexion: 10, bilaterally  Standing Hip ABD: 10, bilaterally  Standing Calf Raises: 10  Standing Marching: 10     Received interferential E.M.S. with ice x 15 minutes ([id number]) to left knee in supine.  Normal skin integrity after treatment.      Assessment/Plan: Patient with pain with most exercises.  Plan to progress exercises as tolerated.      Treatments and minutes performed:  Therapeutic Exercises: 25 Minutes  E.M.S.: 15 Minutes     Total Timed Treatment Minutes: 40 Minutes     Signature:  Holli Humbles, PT, DPT 0822/2023 0900

## 2022-01-06 NOTE — Procedures (Signed)
PFT 1    REQUESTING PHYSICIAN:    Dorcas Carrow, NP   DATE OF INTERPRETATION:  Sunday January 06, 2022.                                INTERPRETING PHYSICIAN:  Berta Minor, M.D.      INDICATIONS FOR TEST:  1. SHORTNESS OF BREATH.     COMMENTS:  1.     Smoking History:  LIFEL[employer] NONSMOKER.  2.     Height:  66 IN;   Weight:  290 LB  3.     Flow volume loop was reviewed.   4.     Prior studies for comparison:  None available at the time of this dictation.          SPIROMETRY ANALYSIS:  1.     The forced vital capacity was 2.68 L, which was 73% of predicted and mildly reduced.    2.     The FEV1 was 2.17 L, which was 77% of predicted and mildly reduced.    3.     The FEV1/FVC ratio was 81%, which is normal.   4.     The FEF 25-75% was 2.17 liters/second, which was 83% of predicted.    Marland Kitchen       FINAL IMPRESSION:  1. THE FORCED VITAL CAPACITY AND THE FEV1 ARE BOTH MILDLY AND PROPORTIONATELY REDUCED AND THE FEV1/FVC RATIO IS NORMAL AT 81%.           A. The normal FEV1/FVC ratio would suggest that there is no gross evidence of obstructive disease, HOWEVER in this particular study, there is definite improvement with the bronchodilator so I do believe there is at least mild obstructive disease present.           B. The reduced FVC and FEV1 may also be due to the height weight proportions of this patient.      2. A RESTRICTIVE PROCESS CAN NOT BE EXCLUDED WITHOUT LUNG VOLUMES.    3. THERE IS DEFINITE IMPROVEMENT WITH THE BRONCHODILATOR ON THIS STUDY AND THE FOLLOWING IS NOTED:           A. The forced vital capacity went from 2.68 L up to 3.01 L which is a 12% improvement.         B. The the FEV1 went from 2.17 L up to 2.45 L which is a 12% improvement.         C. The FEF 25-75 improved by 45% over baseline.    4. THE MAXIMUM VOLUNTARY VENTILATION IS 68 L/M  WITH IS 70% OF PREDICTED AND MILDLY REDUCED          ________________________________________  Berta Minor, M.D.      Board Certified in Internal Medicine, Pulmonary,  and Sleep Medicine     Disclaimer: This note was partially created using voice recognition software and is inherently subject to errors including those of syntax and "sound alike" substitutions which may escape proof reading.  In such instances, original meaning may be extrapolated by contextual derivation.

## 2022-01-08 ENCOUNTER — Encounter (INDEPENDENT_AMBULATORY_CARE_PROVIDER_SITE_OTHER): Payer: Self-pay | Admitting: Registered Nurse

## 2022-01-08 ENCOUNTER — Ambulatory Visit (INDEPENDENT_AMBULATORY_CARE_PROVIDER_SITE_OTHER): Payer: Medicaid Other | Admitting: Registered Nurse

## 2022-01-08 ENCOUNTER — Other Ambulatory Visit: Payer: Self-pay

## 2022-01-08 VITALS — BP 132/78 | HR 70 | Ht 66.0 in | Wt 299.7 lb

## 2022-01-08 DIAGNOSIS — Z8249 Family history of ischemic heart disease and other diseases of the circulatory system: Secondary | ICD-10-CM | POA: Insufficient documentation

## 2022-01-08 DIAGNOSIS — Z6841 Body Mass Index (BMI) 40.0 and over, adult: Secondary | ICD-10-CM | POA: Insufficient documentation

## 2022-01-08 DIAGNOSIS — R0609 Other forms of dyspnea: Secondary | ICD-10-CM | POA: Insufficient documentation

## 2022-01-08 DIAGNOSIS — R0789 Other chest pain: Secondary | ICD-10-CM | POA: Insufficient documentation

## 2022-01-08 DIAGNOSIS — I480 Paroxysmal atrial fibrillation: Secondary | ICD-10-CM | POA: Insufficient documentation

## 2022-01-08 NOTE — H&P (Signed)
[facility]  Cardiology History and Physical      Lauren Berger, 56 y.o. female  MRN: [id number]  CSN: [id number]  Date of Birth:  [birth year]  PCP:  Lauren Conners, NP    Chief Complaint:  Shortness of Breath and Follow Up (Room2 . SOB X 1 year worsening. )      HPI:  Lauren Berger is a 56 y.o. year old female here today for cardiac management.  She states she has a history of Afib but has never been on any medications for it. She is c/o an increase in shortness of breath lately. She has occasional chest tightness. She states they sob and chest tightness occur at rest and with exertion. She states her father, 2 brothers, and mother all have cardiac stents. Mother and father both had CABG and are deceased. She thinks she had a stress test a few years ago at [facility]. She states she was diagnosed with COPD and started on an inhaler by her PCP.     REVIEW OF RECORDS:  CXR 12-25-21: normal   LLE venous doppler 11/2021: negative   Labs 10-09-21: bmp/ph/mg-WNL   EKG 07/2021: NSR   Labs 07-19-21: TSH-WNL   [facility] EKG 2017: NSR   Carotid duplex 2009: negative        Past Medical History  Current Outpatient Medications   Medication Sig    fluticasone propion-salmeteroL (ADVAIR HFA) 115-21 mcg/actuation Inhalation oral inhaler Take 2 Puffs by inhalation Twice daily    lithium carbonate (ESKALITH) 300 mg Oral Capsule Take 1 Capsule (300 mg total) by mouth Three times daily with meals      Allergies   Allergen Reactions    Cephalosporins     Penicillin G Benzathin,Procain     Penicillins     Sulfa (Sulfonamides)      Past Medical History:   Diagnosis Date    Anxiety     Asthma     Atrial fibrillation (CMS HCC)     for the past 15 years, never on meds for it    Bipolar 1 disorder (CMS HCC)     COPD (chronic obstructive pulmonary disease) (CMS HCC)     Depression     Esophageal reflux     Hypertension     Kidney stones     PUD (peptic ulcer disease)            Past Surgical History:   Procedure Laterality Date     FACIAL FRACTURE SURGERY      HX CESAREAN SECTION      HX CHOLECYSTECTOMY      WRIST SURGERY Right        Family Medical History:       Problem Relation (Age of Onset)    Anxiety Mother    Asthma Mother    Coronary Artery Disease Brother    Depression Mother    Emphysema Mother    Heart Attack Father    Heart Disease Father    Heart Surgery Mother, Father    Hypertension (High Blood Pressure) Father            Social History     Socioeconomic History    Marital status: Divorced   Tobacco Use    Smoking status: Never    Smokeless tobacco: Never   Substance and Sexual Activity    Alcohol use: Never    Drug use: Never          ROS:   See HPI  for pertinent of a 10 point review of systems.  All other systems negative.    Exam:  Vitals:    01/08/22 0851   BP: 132/78   Pulse: 70   SpO2: 98%   Weight: 136 kg (299 lb 11.2 oz)   Height: 1.676 m (5\' 6" )   BMI: 48.47           GENERAL APPEARANCE: comfortable, cooperative, well developed, and in no acute distress  HEENT: Head: normocephalic and atraumatic. Eyes: conjunctiva not injected. Ears: auricles have no deformities. Nose: no physical deformity of nares and no discharge. Throat: oral mucous membranes are pink and moist. Neck: no JVD or masses.  CARDIOVASCULAR: S1, S2 regular without murmur. No carotid bruit. Dorsalis pedis and posterior tibial pulses are palpable at 2+. No significant peripheral edema. Allen test <7 seconds.   RESPIRATORY: lungs are clear to auscultation, no wheezing, no rhonchi, no crackles bilaterally. No use of accessory muscles.   ABDOMEN: abdomen is soft, non-tender, with active bowel sounds, no abdominal bruit, obese  SKIN: Intact, no rashes, lesions, warm, and dry.   GU/RECTAL: Deferred.        Assessment/Plan:      ICD-10-CM    1. PAF (paroxysmal atrial fibrillation) (CMS HCC)  I48.0 MYOCARDIAL PERFUSION COMPLETE      2. DOE (dyspnea on exertion)  R06.09 MYOCARDIAL PERFUSION COMPLETE      3. Morbid obesity with BMI of 45.0-49.9, adult (CMS HCC)   E66.01 MYOCARDIAL PERFUSION COMPLETE    Z68.42       4. Chest pressure  R07.89 MYOCARDIAL PERFUSION COMPLETE      5. Family history of heart disease  Z82.49 MYOCARDIAL PERFUSION COMPLETE         Orders Placed This Encounter    MYOCARDIAL PERFUSION COMPLETE      Medication Orders   No Medications ordered      Stress test ordered for [facility] per patient request.   She uses a cane to assist with ambulation.     Plan: Plan of care reviewed with the patient. Reviewed current medications and any changes made during this visit. Advised on heart healthy diet and encouraged with exercise and weight control in regards to risk factors and other comorbidities as well as discussed rationale and compliance with medication therapy. Patient aware to notify the office if any questions or concerns should arise and of new signs or symptoms to be reported. Patient verbalized understanding. Advised patient to bring current medication list to every appointment for continuity of care.      Followup: Return post stress.         [place], FNP-BC  01/08/2022, 08:43

## 2022-05-23 ENCOUNTER — Encounter (INDEPENDENT_AMBULATORY_CARE_PROVIDER_SITE_OTHER): Payer: Self-pay | Admitting: Internal Medicine

## 2022-07-15 ENCOUNTER — Emergency Department
Admission: EM | Admit: 2022-07-15 | Discharge: 2022-07-15 | Disposition: A | Discharge status: Home / Self Care | Payer: Medicaid Other | Attending: Emergency Medicine | Admitting: Emergency Medicine

## 2022-07-15 ENCOUNTER — Encounter (HOSPITAL_COMMUNITY): Payer: Self-pay

## 2022-07-15 ENCOUNTER — Emergency Department (HOSPITAL_COMMUNITY): Payer: Medicaid Other

## 2022-07-15 ENCOUNTER — Other Ambulatory Visit: Payer: Self-pay

## 2022-07-15 DIAGNOSIS — R609 Edema, unspecified: Secondary | ICD-10-CM

## 2022-07-15 DIAGNOSIS — R600 Localized edema: Secondary | ICD-10-CM | POA: Insufficient documentation

## 2022-07-15 DIAGNOSIS — L03116 Cellulitis of left lower limb: Secondary | ICD-10-CM | POA: Insufficient documentation

## 2022-07-15 LAB — CBC WITH DIFF
BASOPHIL #: 0.1 10*3/uL (ref 0.00–0.20)
BASOPHIL %: 1 % (ref 0–2)
EOSINOPHIL #: 0.2 10*3/uL (ref 0.00–0.60)
EOSINOPHIL %: 3 % (ref 0–5)
HCT: 38.8 % (ref 36.0–48.0)
HGB: 12.8 g/dL (ref 12.0–15.0)
LYMPHOCYTE #: 2 10*3/uL (ref 1.10–3.80)
LYMPHOCYTE %: 30 % (ref 19–46)
MCH: 28 pg (ref 24.4–34.0)
MCHC: 33.1 g/dL (ref 30.0–37.0)
MCV: 84.7 fL (ref 79.0–88.0)
MONOCYTE #: 0.5 10*3/uL (ref 0.10–0.80)
MONOCYTE %: 8 % (ref 4–12)
MPV: 7.7 fL (ref 7.5–11.5)
NEUTROPHIL #: 3.8 10*3/uL (ref 1.80–7.50)
NEUTROPHIL %: 59 % (ref 41–69)
PLATELETS: 338 10*3/uL (ref 130–400)
RBC: 4.58 10*6/uL (ref 3.50–5.50)
RDW: 14.5 % — ABNORMAL HIGH (ref 11.5–14.0)
WBC: 6.5 10*3/uL (ref 4.5–11.5)

## 2022-07-15 LAB — BASIC METABOLIC PANEL
ANION GAP: 6 mmol/L (ref 5–19)
BUN/CREA RATIO: 26 — ABNORMAL HIGH (ref 6–20)
BUN: 18 mg/dL — ABNORMAL HIGH (ref 7–17)
CALCIUM: 9.2 mg/dL (ref 8.4–10.2)
CHLORIDE: 109 mmol/L — ABNORMAL HIGH (ref 98–107)
CO2 TOTAL: 25 mmol/L (ref 22–30)
CREATININE: 0.7 mg/dL (ref 0.50–1.00)
ESTIMATED GFR: 101 mL/min/{1.73_m2} (ref >60)
GLUCOSE: 88 mg/dL (ref 74–106)
POTASSIUM: 4.2 mmol/L (ref 3.5–5.1)
SODIUM: 140 mmol/L (ref 137–145)

## 2022-07-15 LAB — B-TYPE NATRIURETIC PEPTIDE (BNP),PLASMA: BNP: <30 pg/mL (ref <=100)

## 2022-07-15 MED ORDER — POTASSIUM CHLORIDE ER 20 MEQ TABLET,EXTENDED RELEASE(PART/CRYST)
40.0000 meq | ORAL_TABLET | ORAL | Status: AC
Start: 2022-07-15 — End: 2022-07-15
  Administered 2022-07-15: 40 meq via ORAL

## 2022-07-15 MED ORDER — FUROSEMIDE 10 MG/ML INJECTION SOLUTION
INTRAMUSCULAR | Status: AC
Start: 2022-07-15 — End: 2022-07-16
  Filled 2022-07-15: qty 4

## 2022-07-15 MED ORDER — FUROSEMIDE 40 MG TABLET
40.0000 mg | ORAL_TABLET | Freq: Every day | ORAL | 0 refills | Status: AC
Start: 2022-07-16 — End: ?

## 2022-07-15 MED ORDER — POTASSIUM CHLORIDE ER 20 MEQ TABLET,EXTENDED RELEASE(PART/CRYST)
ORAL_TABLET | ORAL | Status: AC
Start: 2022-07-15 — End: 2022-07-16
  Filled 2022-07-15: qty 2

## 2022-07-15 MED ORDER — FUROSEMIDE 10 MG/ML INJECTION SOLUTION
40.0000 mg | Freq: Once | INTRAMUSCULAR | Status: AC
Start: 2022-07-15 — End: 2022-07-15
  Administered 2022-07-15: 40 mg via INTRAVENOUS

## 2022-07-15 MED ORDER — CLINDAMYCIN HCL 150 MG CAPSULE
450.0000 mg | ORAL_CAPSULE | Freq: Three times a day (TID) | ORAL | 0 refills | Status: AC
Start: 2022-07-15 — End: 2022-07-30

## 2022-07-15 NOTE — ED Triage Notes (Signed)
Arrives to ER with complaints of bilateral leg edema with redness to the L leg and ankle. States legs have been swollen for 5 days. Was unable to see PCP due to no available appointments. States bilateral legs have pain but is increased to L leg. Has taken an OTC back and body medication.

## 2022-07-15 NOTE — ED Nurses Note (Signed)
Pt given verbal and written d/c instructions. Verbalizes complete understanding of all. Ambulates from unit to POV with steady gait. Belongings on person at time of d/c. Pt exhibits no signs of acute physical distress at time of exit.

## 2022-07-15 NOTE — ED Provider Notes (Signed)
[facility]  ED Primary Provider Note      History of Present Illness   Chief Complaint   Patient presents with    Leg Swelling     Arrival: The patient arrived by Car accompanied by:  fiance  History Provided by: patient  History Limitations: none    Lauren Berger is a 57 y.o. 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  Patient denies any trauma.  She states that her left leg hurts more than her right and she started to develop some redness on it.  Patient tried calling her PCP but was unable to get an appointment.  Denies any chest pain or shortness of breath.    Review of Systems     Constitutional: No fevers or chills.  Skin:  No rashes or lesions.  HENT:  No scalp lesions, ear pain, difficulty hearing, no congestion, rhinorrhea, no sore throat or difficulty swallowing.  Eyes:  No vision changes, no redness, no discharge.  Cardio:  No chest pain, no palpitations.  Respiratory:  No cough, wheezing, or SOB.  GI:  No nausea, vomiting, diarrhea, constipation, no abdominal pain.  GU:  No dysuria, urgency, or frequency.  Endocrine: No polyuria, polydypsia, thyroid issues.  Hematology:  No easy bruising or bleeding.  Musculoskeletal:  No neck pain, no back pain.  Neuro:  No numbness, tingling, or weakness.  No headache.  Psychiatry:  No SI or HI.  Normal mood.     All other ROS are negative except as above and per HPI.    Historical Data     Below pertinent information reviewed with patient/caregiver and/or EMR:  Past Medical History:   Diagnosis Date    Anxiety     Asthma     Atrial fibrillation (CMS HCC)     for the past 15 years, never on meds for it    Bipolar 1 disorder (CMS HCC)     COPD (chronic obstructive pulmonary disease) (CMS  HCC)     Depression     Esophageal reflux     Hypertension     Kidney stones     PUD (peptic ulcer disease)      Past Surgical History:   Procedure Laterality Date    FACIAL FRACTURE SURGERY      HX CESAREAN SECTION      HX CHOLECYSTECTOMY      WRIST SURGERY Right      Previous Medications    FLUTICASONE PROPION-SALMETEROL (ADVAIR HFA) 115-21 MCG/ACTUATION INHALATION ORAL INHALER    Take 2 Puffs by inhalation Twice daily    LITHIUM CARBONATE (ESKALITH) 300 MG ORAL CAPSULE    Take 1 Capsule (300 mg total) by mouth Three times daily with meals     Allergies   Allergen Reactions    Cephalosporins     Penicillin G Benzathin,Procain     Penicillins     Sulfa (Sulfonamides)      Social History     Socioeconomic History    Marital status: Divorced     Spouse name: Not on file    Number of children: Not on file    Years of education: Not on file    Highest education level: Not on file   Occupational History  Not on file   Tobacco Use    Smoking status: Never    Smokeless tobacco: Never   Vaping Use    Vaping status: Never Used   Substance and Sexual Activity    Alcohol use: Never    Drug use: Never    Sexual activity: Not on file   Other Topics Concern    Not on file   Social History Narrative    Not on file     Social Determinants of Health     Financial Resource Strain: Not on file   Transportation Needs: Not on file   Social Connections: Not on file   Intimate Partner Violence: Not on file   Housing Stability: Not on file     Family Medical History:       Problem Relation (Age of Onset)    Anxiety Mother    Asthma Mother    Coronary Artery Disease Brother    Depression Mother    Emphysema Mother    Heart Attack Father    Heart Disease Father    Heart Surgery Mother, Father    Hypertension (High Blood Pressure) Father              Physical Exam     ED Triage Vitals   BP (Non-Invasive) 07/15/22 1252 134/80   Heart Rate 07/15/22 1252 80   Respiratory Rate 07/15/22 1252 18   Temperature 07/15/22 1252 36.9 C (98.4 F)    SpO2 07/15/22 1252 99 %   Weight 07/15/22 1246 135 kg (298 lb)   Height 07/15/22 1246 1.676 m ('5\' 6"'$ )       All ED Vitals  Filed Vitals:    07/15/22 1252   BP: 134/80   Pulse: 80   Resp: 18   Temp: 36.9 C (98.4 F)   SpO2: 99%       Nursing notes and vital signs reviewed.      Constitutional:  Patient is well-developed and well-nourished.  A&O x 3.   Head:  Normocephalic and atraumatic.  Neck:  Soft, supple, painless range of motion.  Eyes:  Conjunctivae are normal. Pupils are 2-3 mm equal, round, and reactive to light.  Extraocular movement intact.  Ears, Nose, Throat:  Both external ears normal.  Nares clear, no congestion.  Uvula midline.  Oropharynx without erythema. Mucous membranes moist.  Cardiovascular:  Regular rate.  Regular rhythm.   Distal pulses present and equal bilaterally.  Pulmonary:  Airway normal.  Bilateral breath sounds clear, without wheezes, rales or rhonchi.  Abdominal: Soft, nontender, nondistended, positive bowel sounds.  No rebound, guarding, or masses.  Neurological:  Cranial nerves 2-12 grossly intact.  No focal motor or sensory deficits noted.  Musculoskeletal:  No acute gross deformities.  Normal gait.  Extremities:  No clubbing, cyanosis.   3+ nonpitting edema bilateral legs, inspection of the left anterior leg reveals some erythema without induration, nonblanching hemosiderin deposits noted as well.  Psychiatric: Patient has a normal mood and affect.   Skin: Warm and dry. Normal cap refill <2s.   Breast: deferred.  Genitourinary: deferred.  Rectal: deferred.    Patient Data     Emergency Department Workup:    Labs:   Results for orders placed or performed during the hospital encounter of 07/15/22 (from the past 12 hour(s))   BASIC METABOLIC PANEL   Result Value Ref Range    SODIUM 140 137 - 145 mmol/L    POTASSIUM 4.2 3.5 - 5.1 mmol/L  CHLORIDE 109 (H) 98 - 107 mmol/L    CO2 TOTAL 25 22 - 30 mmol/L    ANION GAP 6 5 - 19 mmol/L    CALCIUM 9.2 8.4 - 10.2 mg/dL    GLUCOSE 88 74 -  106 mg/dL    BUN 18 (H) 7 - 17 mg/dL    CREATININE 0.70 0.50 - 1.00 mg/dL    BUN/CREA RATIO 26 (H) 6 - 20    ESTIMATED GFR 101 >60 mL/min/1.56m2   B-TYPE NATRIURETIC PEPTIDE (BNP),PLASMA   Result Value Ref Range    BNP <30 <=100 pg/mL   CBC WITH DIFF   Result Value Ref Range    WBC 6.5 4.5 - 11.5 x10^3/uL    RBC 4.58 3.50 - 5.50 x10^6/uL    HGB 12.8 12.0 - 15.0 g/dL    HCT 38.8 36.0 - 48.0 %    MCV 84.7 79.0 - 88.0 fL    MCH 28.0 24.4 - 34.0 pg    MCHC 33.1 30.0 - 37.0 g/dL    RDW 14.5 (H) 11.5 - 14.0 %    PLATELETS 338 130 - 400 x10^3/uL    MPV 7.7 7.5 - 11.5 fL    NEUTROPHIL % 59 41 - 69 %    LYMPHOCYTE % 30 19 - 46 %    MONOCYTE % 8 4 - 12 %    EOSINOPHIL % 3 0 - 5 %    BASOPHIL % 1 0 - 2 %    NEUTROPHIL # 3.80 1.80 - 7.50 x10^3/uL    LYMPHOCYTE # 2.00 1.10 - 3.80 x10^3/uL    MONOCYTE # 0.50 0.10 - 0.80 x10^3/uL    EOSINOPHIL # 0.20 0.00 - 0.60 x10^3/uL    BASOPHIL # 0.10 0.00 - 0.20 x10^3/uL       Radiology Imaging:       EKG: None  see CVIS Tests/MUSE for actual image    Procedure(s)  Procedures    Medical Decision Making   MDM/Course:  13:02  Patient seen and examined shortly after arrival.  Will check basic labs, an ultrasound and update note results and plan of care.      14:40  Ultrasound negative for DVT.  Blood work unremarkable as well.  Patient has a BMI of 48 and this obesity is causing issues with dependent edema.    Will give IV Lasix 40 mg now, sent home with 4 additional days of Lasix until she is able to follow-up with her PCP to discuss long-term care plans.    Patient has hemosiderin deposit in her left leg.  Difficult to tell if there is a superimposed cellulitis as well.  Will call in a prescription for some clindamycin for her.    Medical Decision Making  Amount and/or Complexity of Data Reviewed  Labs: ordered.    Risk  Prescription drug management.             Medications given:  Medications Administered in the ED   furosemide (LASIX) 10 mg/mL injection (40 mg Intravenous Given 07/15/22  1440)   potassium chloride (K-DUR) extended release tablet (40 mEq Oral Given 07/15/22 1440)       Clinical Impression   Bilateral leg edema   Dependent edema (Primary)   Left leg cellulitis     Following the history, physical exam, and ED workup, the patient was deemed stable and suitable for discharge. The patient/caregiver was advised to return to the ED for any new or worsening symptoms. Discharge medications, and  follow-up instructions were discussed with the patient/caregiver in detail, who verbalizes understanding. The patient/caregiver is in agreement and is comfortable with the plan of care.    Disposition: Discharged         Current Discharge Medication List        START taking these medications.        Details   clindamycin 150 mg Capsule  Commonly known as: CLEOCIN   450 mg, Oral, 3 TIMES DAILY  Qty: 63 Capsule  Refills: 0     furosemide 40 mg Tablet  Commonly known as: LASIX  Start taking on: July 16, 2022   40 mg, Oral, DAILY  Qty: 4 Tablet  Refills: 0            CONTINUE these medications - NO CHANGES were made during your visit.        Details   Advair HFA 115-21 mcg/actuation oral inhaler  Generic drug: fluticasone propion-salmeteroL   2 Puffs, Inhalation, 2 TIMES DAILY  Refills: 0     lithium carbonate 300 mg Capsule  Commonly known as: ESKALITH   300 mg, Oral, 3 TIMES DAILY WITH MEALS  Refills: 0            Follow up:   Katrina Stack, NP  [place] [address]  [phone]    Schedule an appointment as soon as possible for a visit                      Pixie Casino, MD    Parts of this patient's chart may have been completed in a retrospective fashion due to simultaneous direct patient care activities in the Emergency Department.     This note was partially generated using MModal Fluency Direct dictation system, and there may be some incorrect words, spellings, and/or punctuation that were not noted in checking the note before saving, including those of syntax and  "sound alike" substitutions which may escape proof reading.  In such instances, original meaning may be extrapolated by contextual derivation.

## 2022-07-15 NOTE — ED Nurses Note (Signed)
Dr. Klapchar at bedside.

## 2022-07-30 ENCOUNTER — Emergency Department (HOSPITAL_COMMUNITY): Payer: Medicaid Other

## 2022-07-30 ENCOUNTER — Emergency Department
Admission: EM | Admit: 2022-07-30 | Discharge: 2022-07-30 | Disposition: A | Discharge status: Home / Self Care | Payer: Medicaid Other | Source: Ambulatory Visit | Attending: EMERGENCY MEDICINE | Admitting: EMERGENCY MEDICINE

## 2022-07-30 ENCOUNTER — Encounter (HOSPITAL_COMMUNITY): Payer: Self-pay

## 2022-07-30 ENCOUNTER — Other Ambulatory Visit: Payer: Self-pay

## 2022-07-30 DIAGNOSIS — J441 Chronic obstructive pulmonary disease with (acute) exacerbation: Secondary | ICD-10-CM | POA: Insufficient documentation

## 2022-07-30 DIAGNOSIS — Z1152 Encounter for screening for COVID-19: Secondary | ICD-10-CM | POA: Insufficient documentation

## 2022-07-30 DIAGNOSIS — R0981 Nasal congestion: Secondary | ICD-10-CM | POA: Insufficient documentation

## 2022-07-30 LAB — COVID-19, FLU A/B, RSV RAPID BY PCR - LAB USE ONLY
INFLUENZA VIRUS TYPE A: NOT DETECTED
INFLUENZA VIRUS TYPE B: NOT DETECTED
RESPIRATORY SYNCTIAL VIRUS (RSV): NOT DETECTED
SARS-CoV-2: NOT DETECTED

## 2022-07-30 MED ORDER — AZITHROMYCIN 250 MG TABLET
ORAL_TABLET | ORAL | 0 refills | Status: AC
Start: 2022-07-30 — End: ?

## 2022-07-30 MED ORDER — IPRATROPIUM 0.5 MG-ALBUTEROL 3 MG (2.5 MG BASE)/3 ML NEBULIZATION SOLN
3.0000 mL | INHALATION_SOLUTION | RESPIRATORY_TRACT | Status: AC
Start: 2022-07-30 — End: 2022-07-30
  Administered 2022-07-30: 3 mL via RESPIRATORY_TRACT
  Filled 2022-07-30: qty 3

## 2022-07-30 MED ORDER — OXYMETAZOLINE 0.05 % NASAL SPRAY
1.0000 | Freq: Two times a day (BID) | NASAL | 0 refills | Status: AC
Start: 2022-07-30 — End: ?

## 2022-07-30 MED ORDER — PSEUDOEPHEDRINE-GUAIFENESIN ER 60 MG-600 MG TABLET,EXTEND RELEASE 12HR
1.0000 | ORAL_TABLET | Freq: Two times a day (BID) | ORAL | 0 refills | Status: AC
Start: 2022-07-30 — End: ?

## 2022-07-30 NOTE — ED Nurses Note (Signed)
Pt given verbal and written d/c instructions. Verbalizes complete understanding of all. Will pick up new prescriptions from pharmacy. Encouraged to return to ED with any increased shortness of breath, chest pain, or any other concerning s/s. Ambulates from unit to POV with steady gait. Belongings on person at time of d/c. Pt exhibits no signs of acute physical distress at time of exit.

## 2022-07-30 NOTE — ED Provider Notes (Signed)
[facility]  ED Primary Provider Note  History of Present Illness   Chief Complaint   Patient presents with    Chest Congestion     Arrival: The patient arrived by Car  Lauren Berger is a 57 y.o. female who had concerns including Chest Congestion.  Patient has a history of bipolar, COPD, peptic ulcer disease, asthma, reported AFib.  States that she has been having chest congestion sinus congestion drainage sore throat cough for the past 2-3 days.  Symptoms have continued to worsen so she came in for evaluation.  She denied any fevers but states she felt like she can take a deep breath.  She has had a productive cough of greenish/yellow phlegm.  She denies any diarrhea.  She has been using inhaler not have a nebulizer.  She currently denies any chest pain, abdominal pain and she has had some intermittent headaches.    Review of Systems   All other systems reviewed and are negative other than mentioned in HPI.    Historical Data   History Reviewed This Encounter: Medical History  Surgical History  Family History  Social History    Physical Exam   ED Triage Vitals [07/30/22 1326]   BP (Non-Invasive) (!) 151/89   Heart Rate 93   Respiratory Rate (!) 24   Temperature 36.8 C (98.3 F)   SpO2 98 %   Weight 132 kg (290 lb)   Height 1.676 m (5\' 6" )       Constitutional:  57 y.o. female who appears in no distress. Normal color, no cyanosis.   HENT:   Head: Normocephalic and atraumatic.   Mouth/Throat: Oropharynx is clear and moist.   Cardiovascular: RRR, No murmurs, Intact distal pulses.  Pulmonary/Chest:  Mild tachypnea with diminished air movement bilaterally course breath sounds bilaterally  Abdominal: Bowel sounds present and normal. Abdomen soft, no tenderness, no rebound and no guarding.        Musculoskeletal: No edema, tenderness or deformity.  Skin: warm and dry. No rash, erythema, pallor or cyanosis  Psychiatric: normal mood and affect. Behavior is normal.   Neurological:  Patient keenly alert and responsive, speaking in fluent sentences, moving all extremities equally and fully, normal gait    Patient Data     Labs Ordered/Reviewed   COVID-19, FLU A/B, RSV RAPID BY PCR - LAB USE ONLY - Normal    Narrative:     Results are for the simultaneous qualitative identification of SARS-CoV-2 (formerly 2019-nCoV), Influenza A, Influenza B, and RSV RNA. These etiologic agents are generally detectable in nasopharyngeal and nasal swabs during the ACUTE PHASE of infection. Hence, this test is intended to be performed on respiratory specimens collected from individuals with signs and symptoms of upper respiratory tract infection who meet Centers for Disease Control and Prevention (CDC) clinical and/or epidemiological criteria for Coronavirus Disease 2019 (COVID-19) testing. CDC COVID-19 criteria for testing on human specimens is available at [facility] webpage information for Healthcare Professionals: Coronavirus Disease 2019 (COVID-19) ([employer]).     False-negative results may occur if the virus has genomic mutations, insertions, deletions, or rearrangements or if performed very early in the course of illness. Otherwise, negative results indicate virus specific RNA targets are not detected, however negative results do not preclude SARS-CoV-2 infection/COVID-19, Influenza, or Respiratory syncytial virus infection. Results should not be used as the sole basis for patient management decisions. Negative results must be combined with clinical observations, patient history, and epidemiological information. If upper respiratory tract infection  is still suspected based on exposure history together with other clinical findings, re-testing should be considered.    Disclaimer:   This assay has been authorized by FDA under an Emergency Use Authorization for use in laboratories certified under the Clinical Laboratory Improvement Amendments of 1988 (CLIA), 42 U.S.C.  [phone], to perform high complexity tests. The impacts of vaccines, antiviral therapeutics, antibiotics, chemotherapeutic or immunosuppressant drugs have not been evaluated.     Test methodology:   Cepheid Xpert Xpress SARS-CoV-2/Flu/RSV Assay real-time polymerase chain reaction (RT-PCR) test on the GeneXpert Dx and Xpert Xpress systems.     XR CHEST PA AND LATERAL   Final Result by Edi, [facility] In (03/26 1446)   NEGATIVE CHEST         Radiologist location ID: [place] [facility]        Medical Decision Making  Lauren Berger is a 57 y.o. female who had concerns including Chest Congestion.  Patient has a history of bipolar, COPD, peptic ulcer disease, asthma, reported AFib.  States that she has been having chest congestion sinus congestion drainage sore throat cough for the past 2-3 days.  Symptoms have continued to worsen so she came in for evaluation.  She denied any fevers but states she felt like she can take a deep breath.  She has had a productive cough of greenish/yellow phlegm.  She denies any diarrhea.  She has been using inhaler not have a nebulizer.  She currently denies any chest pain, abdominal pain and she has had some intermittent headaches.  On physical exam patient is alert and oriented answering questions appropriately.  Nurse mild tachypnea with diminished air movement throughout coarse breath sounds.  Abdomen soft and nontender.  She is moving all extremities.  Diagnosis considered asthma exacerbation, COPD exacerbation, pneumonia, influenza, COVID, viral syndrome.  Patient did undergo viral swab as well as x-ray of the chest.  She was given a DuoNeb.  The patient states her breathing felt better after the DuoNeb.  Viral swab is negative and the x-ray shows no obvious signs of pneumonia.  At this time given the patient's size and congestion drainage worsening cough plan will be to injury with Afrin nasal spray, guaifenesin , and dizzy continue to treat more as a  COPD exacerbation apparent this time patient has feeling better and agreeable to discharge plan was given strict return to ED instructions.    Amount and/or Complexity of Data Reviewed  Radiology: ordered.    Risk  OTC drugs.  Prescription drug management.             Medications Administered in the ED   ipratropium-albuterol 0.5 mg-3 mg(2.5 mg base)/3 mL Solution for Nebulization (3 mL Nebulization Given 07/30/22 1352)     Clinical Impression   COPD exacerbation (CMS HCC) (Primary)   Sinus congestion       Disposition: Discharged

## 2022-07-30 NOTE — ED Triage Notes (Signed)
Pt presents to the er with complaints of chest congestion, head congestion, no appetite, can't breath and coughing up greenish/yellow phlegm. X 2 days.

## 2022-07-30 NOTE — ED Nurses Note (Signed)
Dr. Mariea Clonts in room to discuss results and POC.

## 2022-07-30 NOTE — Ancillary Notes (Signed)
ITHR reviewed prior to patient arrival to Imaging Department.  Radiology exam completed without complication.  Patient returned to unit by this Imaging Technologist.  Patient care team given exam related information.

## 2022-12-20 ENCOUNTER — Other Ambulatory Visit: Payer: Self-pay | Admitting: Registered Nurse

## 2023-03-20 ENCOUNTER — Other Ambulatory Visit: Payer: Self-pay | Admitting: Adult Health

## 2023-04-12 ENCOUNTER — Other Ambulatory Visit: Payer: Self-pay

## 2023-04-12 ENCOUNTER — Ambulatory Visit: Payer: Medicaid Other | Attending: Registered Nurse

## 2023-04-12 DIAGNOSIS — F319 Bipolar disorder, unspecified: Secondary | ICD-10-CM | POA: Insufficient documentation

## 2023-04-12 LAB — LITHIUM LEVEL: LITHIUM LEVEL: 0.33 mmol/L — ABNORMAL LOW (ref 0.60–1.20)

## 2023-04-12 LAB — COMPREHENSIVE METABOLIC PNL, FASTING
ALBUMIN/GLOBULIN RATIO: 1.6 (ref 1.5–2.5)
ALBUMIN: 4.6 g/dL (ref 3.5–5.0)
ALKALINE PHOSPHATASE: 81 U/L (ref 38–126)
ALT (SGPT): 39 U/L — ABNORMAL HIGH (ref 5–34)
ANION GAP: 9 mmol/L (ref 5–19)
AST (SGOT): 22 U/L (ref 14–36)
BILIRUBIN TOTAL: 0.7 mg/dL (ref 0.2–1.3)
BUN/CREA RATIO: 23 — ABNORMAL HIGH (ref 6–20)
BUN: 18 mg/dL — ABNORMAL HIGH (ref 7–17)
CALCIUM: 9.4 mg/dL (ref 8.4–10.2)
CHLORIDE: 107 mmol/L (ref 98–107)
CO2 TOTAL: 22 mmol/L (ref 22–30)
CREATININE: 0.8 mg/dL (ref 0.50–1.00)
ESTIMATED GFR: 85 mL/min/{1.73_m2} (ref >60)
GLUCOSE: 100 mg/dL (ref 74–106)
POTASSIUM: 4.7 mmol/L (ref 3.5–5.1)
PROTEIN TOTAL: 7.4 g/dL (ref 6.3–8.2)
SODIUM: 138 mmol/L (ref 137–145)

## 2023-06-24 ENCOUNTER — Other Ambulatory Visit: Payer: Self-pay | Admitting: Registered Nurse

## 2023-07-03 ENCOUNTER — Other Ambulatory Visit: Payer: Self-pay | Admitting: Adult Health

## 2023-09-23 ENCOUNTER — Other Ambulatory Visit: Payer: Self-pay | Admitting: Registered Nurse

## 2023-10-02 ENCOUNTER — Other Ambulatory Visit: Payer: Self-pay | Admitting: Adult Health

## 2023-12-26 ENCOUNTER — Other Ambulatory Visit: Payer: Self-pay

## 2023-12-26 MED ORDER — LITHIUM CARBONATE ER 300 MG TABLET,EXTENDED RELEASE
900.0000 mg | ORAL_TABLET | Freq: Every day | ORAL | 1 refills | Status: AC
Start: 2023-12-26 — End: ?
  Filled 2023-12-26: qty 270, 90d supply, fill #0
  Filled 2024-03-20: qty 270, 90d supply, fill #1

## 2023-12-26 MED ORDER — CLONIDINE HCL 0.1 MG TABLET
0.1000 mg | ORAL_TABLET | Freq: Every evening | ORAL | 2 refills | Status: AC
Start: 2023-12-26 — End: ?
  Filled 2023-12-26: qty 30, 30d supply, fill #0
  Filled 2024-01-22: qty 30, 30d supply, fill #1
  Filled 2024-02-26: qty 30, 30d supply, fill #2

## 2023-12-27 ENCOUNTER — Other Ambulatory Visit: Payer: Self-pay

## 2023-12-29 ENCOUNTER — Other Ambulatory Visit: Payer: Self-pay

## 2023-12-29 MED FILL — meloxicam 7.5 mg tablet: ORAL | 30 days supply | Qty: 30 | Fill #0 | Status: AC

## 2023-12-29 MED FILL — budesonide-formoteroL HFA 160 mcg-4.5 mcg/actuation aerosol inhaler: RESPIRATORY_TRACT | 30 days supply | Qty: 10.2 | Fill #0 | Status: AC

## 2023-12-29 MED FILL — diclofenac 1 % topical gel: CUTANEOUS | 15 days supply | Qty: 100 | Fill #0 | Status: AC

## 2023-12-29 MED FILL — cloNIDine HCL 0.1 mg tablet: ORAL | 30 days supply | Qty: 30 | Fill #0 | Status: CN

## 2023-12-29 MED FILL — albuterol sulfate HFA 90 mcg/actuation aerosol inhaler: RESPIRATORY_TRACT | 25 days supply | Qty: 18 | Fill #0 | Status: AC

## 2024-01-03 ENCOUNTER — Other Ambulatory Visit: Payer: Self-pay

## 2024-01-08 ENCOUNTER — Other Ambulatory Visit: Payer: Self-pay

## 2024-01-13 ENCOUNTER — Other Ambulatory Visit: Payer: Self-pay

## 2024-01-22 MED FILL — diclofenac 1 % topical gel: CUTANEOUS | 15 days supply | Qty: 100 | Fill #1 | Status: AC

## 2024-01-26 MED FILL — budesonide-formoteroL HFA 160 mcg-4.5 mcg/actuation aerosol inhaler: RESPIRATORY_TRACT | 30 days supply | Qty: 10.2 | Fill #1 | Status: AC

## 2024-01-26 MED FILL — meloxicam 7.5 mg tablet: ORAL | 30 days supply | Qty: 30 | Fill #1 | Status: AC

## 2024-01-27 ENCOUNTER — Other Ambulatory Visit: Payer: Self-pay

## 2024-01-30 ENCOUNTER — Other Ambulatory Visit: Payer: Self-pay

## 2024-02-12 MED FILL — diclofenac 1 % topical gel: CUTANEOUS | 15 days supply | Qty: 100 | Fill #2 | Status: AC

## 2024-02-13 ENCOUNTER — Other Ambulatory Visit: Payer: Self-pay

## 2024-02-14 ENCOUNTER — Other Ambulatory Visit: Payer: Self-pay

## 2024-02-26 ENCOUNTER — Other Ambulatory Visit: Payer: Self-pay

## 2024-02-26 MED FILL — diclofenac 1 % topical gel: CUTANEOUS | 15 days supply | Qty: 100 | Fill #3 | Status: AC

## 2024-02-28 ENCOUNTER — Other Ambulatory Visit: Payer: Self-pay

## 2024-03-02 ENCOUNTER — Other Ambulatory Visit: Payer: Self-pay

## 2024-03-07 ENCOUNTER — Other Ambulatory Visit: Payer: Self-pay

## 2024-03-08 ENCOUNTER — Other Ambulatory Visit: Payer: Self-pay

## 2024-03-08 MED ORDER — BUDESONIDE-FORMOTEROL HFA 160 MCG-4.5 MCG/ACTUATION AEROSOL INHALER
2.0000 | INHALATION_SPRAY | Freq: Two times a day (BID) | RESPIRATORY_TRACT | 2 refills | Status: DC
  Filled 2024-03-08: qty 10.2, 30d supply, fill #0
  Filled 2024-04-06: qty 10.2, 30d supply, fill #1
  Filled 2024-05-03: qty 10.2, 30d supply, fill #2

## 2024-03-09 ENCOUNTER — Other Ambulatory Visit: Payer: Self-pay

## 2024-03-11 ENCOUNTER — Other Ambulatory Visit: Payer: Self-pay

## 2024-03-11 MED FILL — diclofenac 1 % topical gel: CUTANEOUS | 15 days supply | Qty: 100 | Fill #4 | Status: AC

## 2024-03-12 ENCOUNTER — Other Ambulatory Visit: Payer: Self-pay

## 2024-03-13 ENCOUNTER — Other Ambulatory Visit: Payer: Self-pay

## 2024-03-20 ENCOUNTER — Other Ambulatory Visit: Payer: Self-pay

## 2024-03-23 ENCOUNTER — Other Ambulatory Visit: Payer: Self-pay

## 2024-03-23 MED ORDER — LITHIUM CARBONATE ER 300 MG TABLET,EXTENDED RELEASE
900.0000 mg | ORAL_TABLET | Freq: Every day | ORAL | 1 refills | Status: AC
  Filled 2024-03-23 – 2024-06-09 (×2): qty 270, 90d supply, fill #0

## 2024-03-23 MED ORDER — DICLOFENAC 1 % TOPICAL GEL
CUTANEOUS | 5 refills | Status: AC
  Filled 2024-03-23: qty 100, 25d supply, fill #0
  Filled 2024-04-12: qty 100, 25d supply, fill #1
  Filled 2024-04-30: qty 100, 13d supply, fill #2
  Filled 2024-05-21: qty 100, 13d supply, fill #3

## 2024-03-23 MED ORDER — CLONIDINE HCL 0.1 MG TABLET
ORAL_TABLET | ORAL | 2 refills | Status: AC
  Filled 2024-03-23: qty 30, 30d supply, fill #0
  Filled 2024-04-18: qty 30, 30d supply, fill #1
  Filled 2024-05-21 – 2024-06-09 (×2): qty 30, 30d supply, fill #2

## 2024-03-26 ENCOUNTER — Other Ambulatory Visit: Payer: Self-pay

## 2024-03-27 ENCOUNTER — Other Ambulatory Visit: Payer: Self-pay

## 2024-03-28 ENCOUNTER — Other Ambulatory Visit: Payer: Self-pay

## 2024-03-29 ENCOUNTER — Other Ambulatory Visit: Payer: Self-pay

## 2024-03-29 MED ORDER — ALBUTEROL SULFATE HFA 90 MCG/ACTUATION AEROSOL INHALER
INHALATION_SPRAY | RESPIRATORY_TRACT | 1 refills | Status: AC
  Filled 2024-03-29: qty 18, 18d supply, fill #0
  Filled 2024-04-02: qty 18, 25d supply, fill #0
  Filled 2024-04-27: qty 18, 25d supply, fill #1

## 2024-04-01 ENCOUNTER — Other Ambulatory Visit: Payer: Self-pay

## 2024-04-02 ENCOUNTER — Other Ambulatory Visit: Payer: Self-pay

## 2024-04-06 ENCOUNTER — Other Ambulatory Visit: Payer: Self-pay

## 2024-04-08 ENCOUNTER — Other Ambulatory Visit: Payer: Self-pay

## 2024-04-09 ENCOUNTER — Other Ambulatory Visit: Payer: Self-pay

## 2024-04-12 ENCOUNTER — Other Ambulatory Visit: Payer: Self-pay

## 2024-04-13 ENCOUNTER — Other Ambulatory Visit: Payer: Self-pay

## 2024-04-16 ENCOUNTER — Other Ambulatory Visit: Payer: Self-pay

## 2024-04-18 ENCOUNTER — Other Ambulatory Visit: Payer: Self-pay

## 2024-04-19 ENCOUNTER — Other Ambulatory Visit: Payer: Self-pay

## 2024-04-23 ENCOUNTER — Other Ambulatory Visit: Payer: Self-pay

## 2024-04-27 ENCOUNTER — Other Ambulatory Visit: Payer: Self-pay

## 2024-04-30 ENCOUNTER — Other Ambulatory Visit: Payer: Self-pay

## 2024-05-01 ENCOUNTER — Other Ambulatory Visit: Payer: Self-pay

## 2024-05-02 ENCOUNTER — Other Ambulatory Visit: Payer: Self-pay

## 2024-05-03 ENCOUNTER — Other Ambulatory Visit: Payer: Self-pay

## 2024-05-07 ENCOUNTER — Other Ambulatory Visit: Payer: Self-pay

## 2024-05-08 ENCOUNTER — Other Ambulatory Visit: Payer: Self-pay

## 2024-05-12 ENCOUNTER — Other Ambulatory Visit: Payer: Self-pay

## 2024-05-21 ENCOUNTER — Other Ambulatory Visit: Payer: Self-pay

## 2024-05-26 ENCOUNTER — Other Ambulatory Visit: Payer: Self-pay

## 2024-05-27 ENCOUNTER — Other Ambulatory Visit: Payer: Self-pay

## 2024-05-27 MED ORDER — BUDESONIDE-FORMOTEROL HFA 160 MCG-4.5 MCG/ACTUATION AEROSOL INHALER
2.0000 | INHALATION_SPRAY | Freq: Two times a day (BID) | RESPIRATORY_TRACT | 2 refills | Status: AC
  Filled 2024-05-27: qty 10.2, 30d supply, fill #0

## 2024-06-07 ENCOUNTER — Other Ambulatory Visit: Payer: Self-pay

## 2024-06-10 ENCOUNTER — Other Ambulatory Visit: Payer: Self-pay
